# Patient Record
Sex: Female | Born: 1995 | Race: Black or African American | Hispanic: No | Marital: Single | Smoking: Never smoker
Health system: Southern US, Community
[De-identification: ages and names within clinical notes are randomized; demographics above are authoritative.]

## PROBLEM LIST (undated history)

## (undated) HISTORY — PX: DILATION AND CURETTAGE OF UTERUS: SHX78

---

## 2015-04-26 ENCOUNTER — Encounter (HOSPITAL_COMMUNITY): Payer: Self-pay | Admitting: Emergency Medicine

## 2015-04-26 ENCOUNTER — Emergency Department (HOSPITAL_COMMUNITY)
Admission: EM | Admit: 2015-04-26 | Discharge: 2015-04-26 | Disposition: A | Discharge status: Home / Self Care | Payer: Medicaid Other | Attending: Emergency Medicine | Admitting: Emergency Medicine

## 2015-04-26 ENCOUNTER — Emergency Department (HOSPITAL_COMMUNITY): Payer: Medicaid Other

## 2015-04-26 DIAGNOSIS — Y9289 Other specified places as the place of occurrence of the external cause: Secondary | ICD-10-CM | POA: Insufficient documentation

## 2015-04-26 DIAGNOSIS — Y9302 Activity, running: Secondary | ICD-10-CM | POA: Insufficient documentation

## 2015-04-26 DIAGNOSIS — S99921A Unspecified injury of right foot, initial encounter: Secondary | ICD-10-CM | POA: Diagnosis present

## 2015-04-26 DIAGNOSIS — Y998 Other external cause status: Secondary | ICD-10-CM | POA: Diagnosis not present

## 2015-04-26 DIAGNOSIS — X58XXXA Exposure to other specified factors, initial encounter: Secondary | ICD-10-CM | POA: Diagnosis not present

## 2015-04-26 DIAGNOSIS — S93601A Unspecified sprain of right foot, initial encounter: Secondary | ICD-10-CM

## 2015-04-26 MED ORDER — IBUPROFEN 800 MG PO TABS
800.0000 mg | ORAL_TABLET | Freq: Once | ORAL | Status: AC
  Administered 2015-04-26: 800 mg via ORAL
  Filled 2015-04-26: qty 1

## 2015-04-26 MED ORDER — IBUPROFEN 800 MG PO TABS
800.0000 mg | ORAL_TABLET | Freq: Three times a day (TID) | ORAL | Status: DC
Start: 2015-04-26 — End: 2017-01-30

## 2015-04-26 NOTE — Progress Notes (Signed)
pcp isw [facility]: [facility] Address: [address] [place], [place] [id number] Contact: [facility] Telephone: [phone]

## 2015-04-26 NOTE — ED Provider Notes (Signed)
CSN: [id number]     Arrival date & time 04/26/15  1224 History  This chart was scribed for non-physician practitioner Sherlene Shams, PA-C working with Rolland Porter, MD by Murriel Hopper, ED Scribe. This patient was seen in room WTR9/WTR9 and the patient's care was started at 1:28 PM.    Chief Complaint  Patient presents with  . Foot Pain    c/o pain in r/foot x1 day      The history is provided by the patient. No language interpreter was used.     HPI Comments: Jacqueline Carr is a 19 y.o. female who presents to the Emergency Department complaining of constant, worsening right foot pain after pt "rolled" her ankle yesterday with associated swelling. Pt states she was running around with her kids yesterday when the incident happened and states that afterwards it did not hurt, but this morning it hurt when she woke up. Pt states she is able to ambulate with some pain.    History reviewed. No pertinent past medical history. History reviewed. No pertinent past surgical history. History reviewed. No pertinent family history. History  Substance Use Topics  . Smoking status: Never Smoker   . Smokeless tobacco: Not on file  . Alcohol Use: Yes     Comment: occ   OB History    No data available     Review of Systems  Musculoskeletal: Positive for joint swelling, arthralgias and gait problem.      Allergies  Review of patient's allergies indicates no known allergies.  Home Medications   Prior to Admission medications   Not on File   BP 97/69 mmHg  Pulse 59  Temp(Src) 98.1 F (36.7 C) (Oral)  Resp 18  Wt 160 lb (72.576 kg)  SpO2 100%  LMP 04/26/2015 (Exact Date) Physical Exam  Constitutional: She is oriented to person, place, and time. She appears well-developed and well-nourished.  HENT:  Head: Normocephalic and atraumatic.  Cardiovascular: Normal rate.   Pulmonary/Chest: Effort normal.  Abdominal: She exhibits no distension.  Musculoskeletal:  Mild swelling to  the lateral right foot. Tender to palpation over fifth and fourth metatarsals. Pain with range of motion of the fourth and fifth toes at MTP joints. Dorsal pedal pulses are normal. No bruising. No erythema. Refill less than 2 seconds distally. Sensation intact in all toes.  Neurological: She is alert and oriented to person, place, and time.  Skin: Skin is warm and dry.  Psychiatric: She has a normal mood and affect.  Nursing note and vitals reviewed.   ED Course  Procedures (including critical care time)  DIAGNOSTIC STUDIES: Oxygen Saturation is 100% on room air, normal by my interpretation.    COORDINATION OF CARE: 1:32 PM Discussed treatment plan with pt at bedside and pt agreed to plan.   Labs Review Labs Reviewed - No data to display  Imaging Review No results found.   EKG Interpretation None      MDM   Final diagnoses:  Foot sprain, right, initial encounter    Patient emergency department with right foot pain after turning it yesterday. She said she had no pain yesterday, pain worsened today when she woke up. Patient does have mild swelling over fourth and fifth metatarsal with tenderness to palpation. She is having difficulty walking on the foot. X-rays are negative. Patient was provided with an Ace wrap and crutches. Most likely a foot sprain. Heart with ice, elevation, naproxen. Follow up as needed.   Filed Vitals:   04/26/15 1305 04/26/15  1308 04/26/15 1457  BP: 97/69    Pulse: 59  60  Temp: 98.1 F (36.7 C)    TempSrc: Oral    Resp: 18    Weight:  160 lb (72.576 kg)   SpO2: 100%  100%    I personally performed the services described in this documentation, which was scribed in my presence. The recorded information has been reviewed and is accurate.   Jaynie Crumbleatyana Johnross Nabozny, PA-C 04/26/15 2003  Rolland PorterMark James, MD 05/02/15 2047

## 2015-04-26 NOTE — ED Notes (Signed)
Pt stated that she "turned " her r/foot yesterday and is concerned because the pain and swelling have increased today. Slight swelling noted on r/foot. Did not attempt to ice or elevate foot

## 2015-04-26 NOTE — Discharge Instructions (Signed)
Elevate your foot, ice several times a day. ACE wrap for compression. Follow up with your doctor as needed. Tylenol or motrin for pain.  Foot Sprain The muscles and cord like structures which attach muscle to bone (tendons) that surround the feet are made up of units. A foot sprain can occur at the weakest spot in any of these units. This condition is most often caused by injury to or overuse of the foot, as from playing contact sports, or aggravating a previous injury, or from poor conditioning, or obesity. SYMPTOMS  Pain with movement of the foot.  Tenderness and swelling at the injury site.  Loss of strength is present in moderate or severe sprains. THE THREE GRADES OR SEVERITY OF FOOT SPRAIN ARE:  Mild (Grade I): Slightly pulled muscle without tearing of muscle or tendon fibers or loss of strength.  Moderate (Grade II): Tearing of fibers in a muscle, tendon, or at the attachment to bone, with small decrease in strength.  Severe (Grade III): Rupture of the muscle-tendon-bone attachment, with separation of fibers. Severe sprain requires surgical repair. Often repeating (chronic) sprains are caused by overuse. Sudden (acute) sprains are caused by direct injury or over-use. DIAGNOSIS  Diagnosis of this condition is usually by your own observation. If problems continue, a caregiver may be required for further evaluation and treatment. X-rays may be required to make sure there are not breaks in the bones (fractures) present. Continued problems may require physical therapy for treatment. PREVENTION  Use strength and conditioning exercises appropriate for your sport.  Warm up properly prior to working out.  Use athletic shoes that are made for the sport you are participating in.  Allow adequate time for healing. Early return to activities makes repeat injury more likely, and can lead to an unstable arthritic foot that can result in prolonged disability. Mild sprains generally heal in 3 to 10  days, with moderate and severe sprains taking 2 to 10 weeks. Your caregiver can help you determine the proper time required for healing. HOME CARE INSTRUCTIONS   Apply ice to the injury for 15-20 minutes, 03-04 times per day. Put the ice in a plastic bag and place a towel between the bag of ice and your skin.  An elastic wrap (like an Ace bandage) may be used to keep swelling down.  Keep foot above the level of the heart, or at least raised on a footstool, when swelling and pain are present.  Try to avoid use other than gentle range of motion while the foot is painful. Do not resume use until instructed by your caregiver. Then begin use gradually, not increasing use to the point of pain. If pain does develop, decrease use and continue the above measures, gradually increasing activities that do not cause discomfort, until you gradually achieve normal use.  Use crutches if and as instructed, and for the length of time instructed.  Keep injured foot and ankle wrapped between treatments.  Massage foot and ankle for comfort and to keep swelling down. Massage from the toes up towards the knee.  Only take over-the-counter or prescription medicines for pain, discomfort, or fever as directed by your caregiver. SEEK IMMEDIATE MEDICAL CARE IF:   Your pain and swelling increase, or pain is not controlled with medications.  You have loss of feeling in your foot or your foot turns cold or blue.  You develop new, unexplained symptoms, or an increase of the symptoms that brought you to your caregiver. MAKE SURE YOU:  Understand these instructions.  Will watch your condition.  Will get help right away if you are not doing well or get worse. Document Released: 04/06/2002 Document Revised: 01/07/2012 Document Reviewed: 06/03/2008 [facility] Patient Information 2015 [place], [place]. This information is not intended to replace advice given to you by your health care provider. Make sure you discuss any  questions you have with your health care provider.

## 2016-07-11 IMAGING — CR DG FOOT COMPLETE 3+V*R*
1 series · 3 of 3 positions shown · non-contrast
Comparison: None.

CLINICAL DATA: Lateral foot pain and swelling since a fall
yesterday.

EXAM:
RIGHT FOOT COMPLETE - 3+ VIEW

[Series 1: AP · right · 3 of 3 slices shown]
[im 1/3]
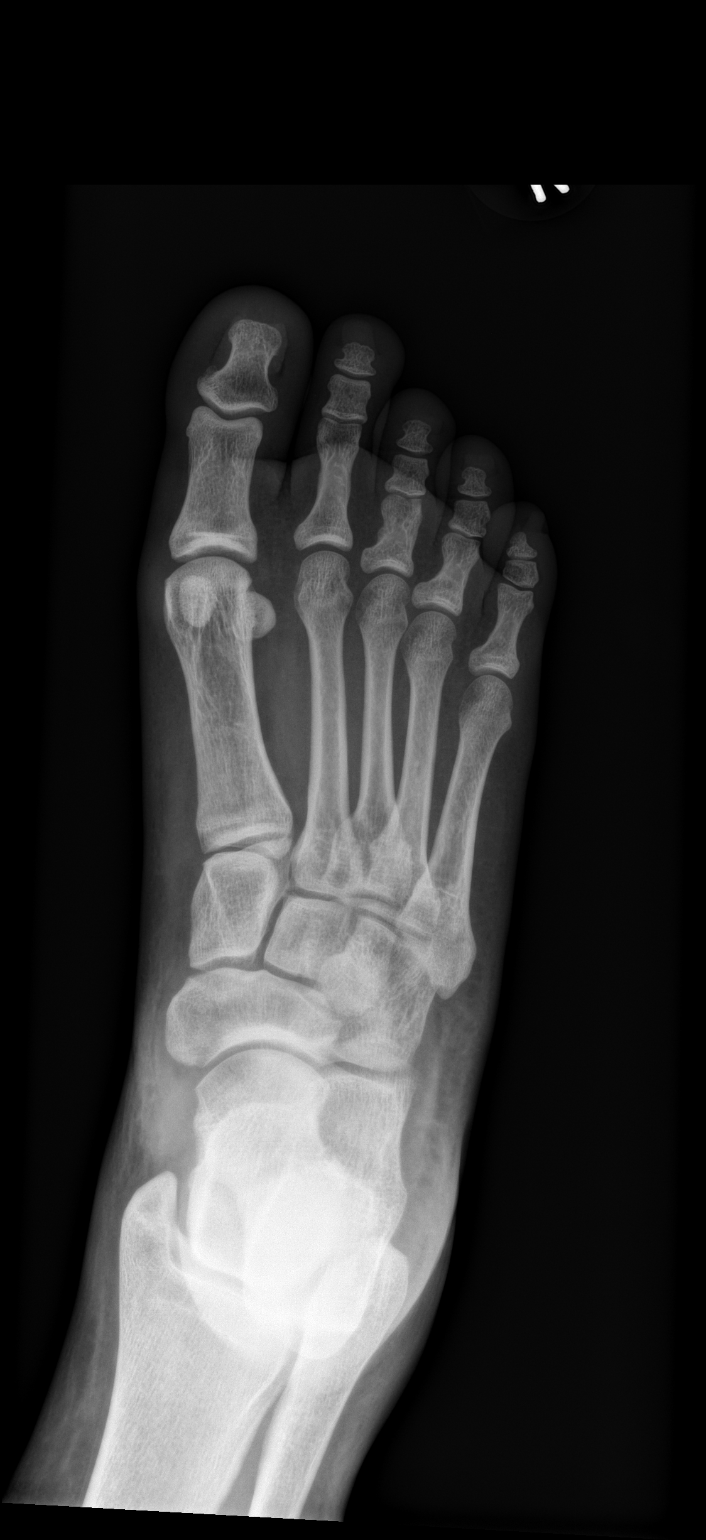
[im 2/3]
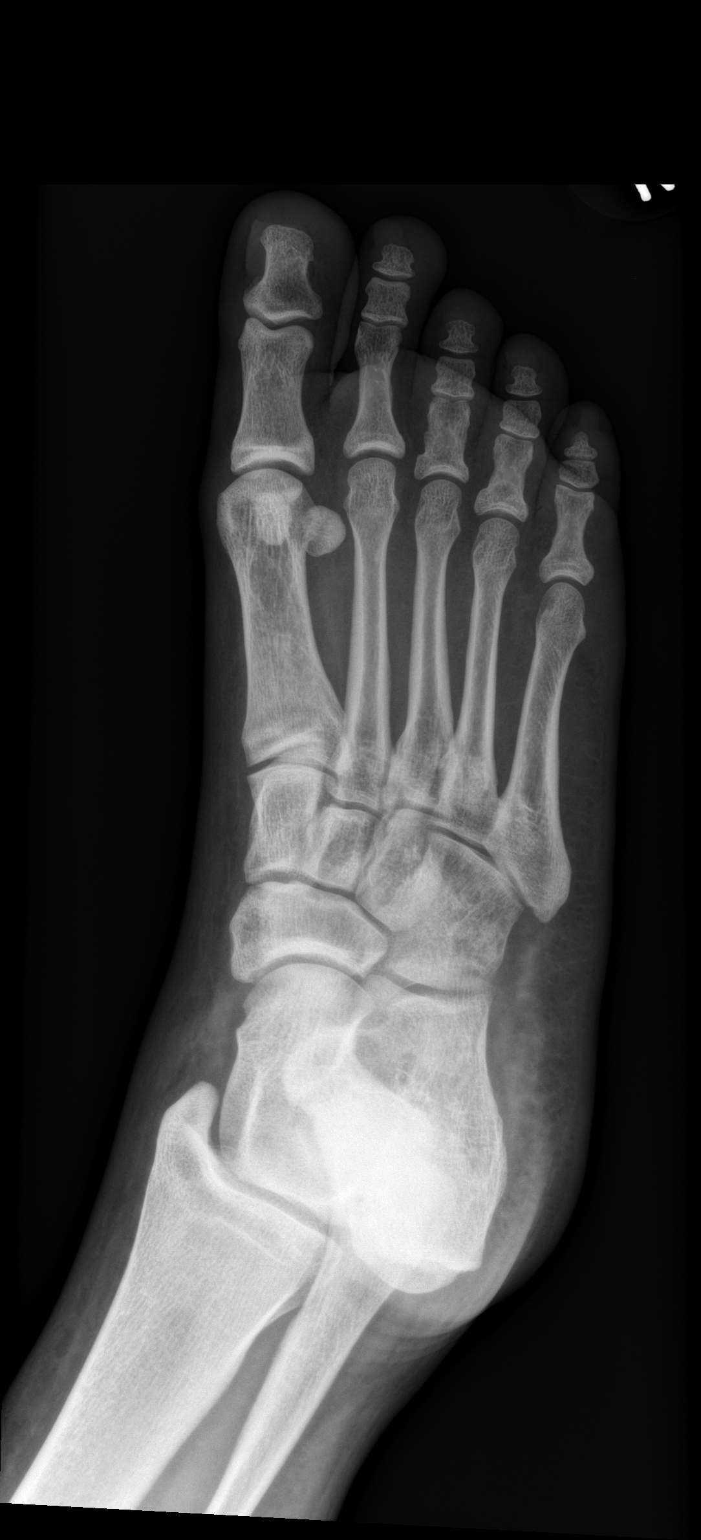
[im 3/3]
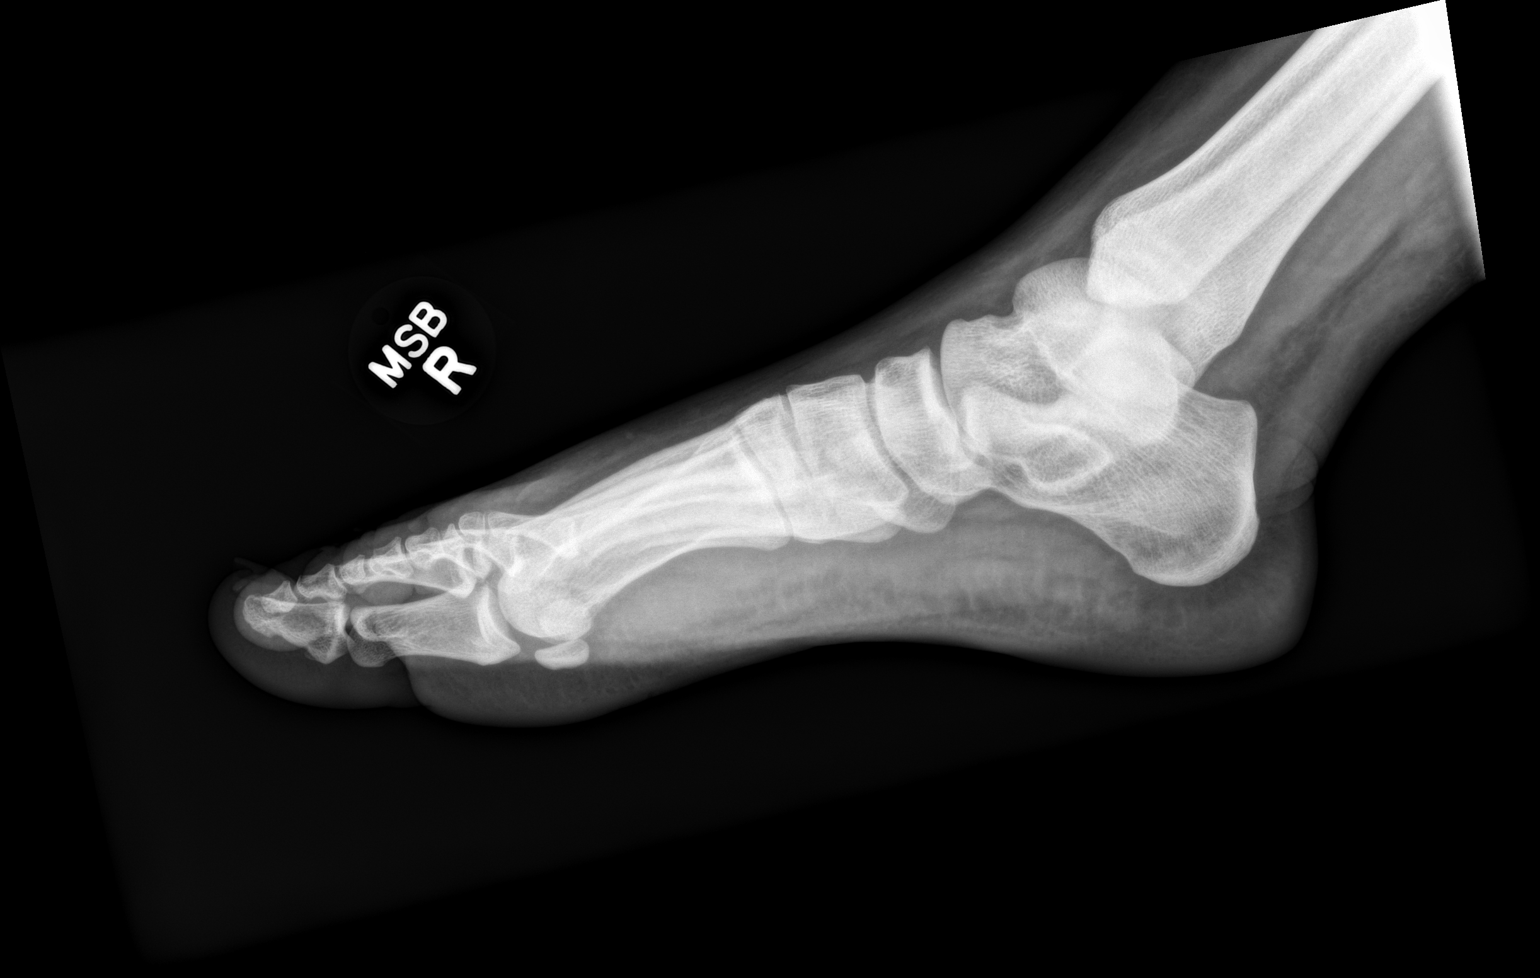

[3 of 3 positions shown; findings below may reference images not displayed]

FINDINGS: There is no evidence of fracture or dislocation. There is no
evidence of arthropathy or other focal bone abnormality. Soft
tissues are unremarkable.
IMPRESSION: Normal exam.

## 2016-08-16 LAB — OB RESULTS CONSOLE GC/CHLAMYDIA
Chlamydia: NEGATIVE
GC PROBE AMP, GENITAL: NEGATIVE

## 2016-08-16 LAB — OB RESULTS CONSOLE ANTIBODY SCREEN: Antibody Screen: NEGATIVE

## 2016-08-16 LAB — OB RESULTS CONSOLE RUBELLA ANTIBODY, IGM: Rubella: IMMUNE

## 2016-08-16 LAB — OB RESULTS CONSOLE HEPATITIS B SURFACE ANTIGEN: Hepatitis B Surface Ag: NEGATIVE

## 2016-08-16 LAB — OB RESULTS CONSOLE HIV ANTIBODY (ROUTINE TESTING): HIV: NONREACTIVE

## 2016-08-16 LAB — OB RESULTS CONSOLE RPR: RPR: NONREACTIVE

## 2016-08-16 LAB — OB RESULTS CONSOLE ABO/RH: RH Type: POSITIVE

## 2016-10-29 NOTE — L&D Delivery Note (Signed)
Patient is 21 y.o. G1P0000 [redacted]w[redacted]d admitted for SROM   Delivery Note At 10:16 PM a viable female was delivered via Vaginal, Spontaneous Delivery  LOP.  APGAR: 6, 9; weight 7 lb 11.5 oz (3500 g).   Complete placenta with 3 vessel cord delivered without complication.   Anesthesia: lidocaine Episiotomy: None Lacerations: left anterior Vaginal;Labial  Suture Repair: 3.0 vicryl Est. Blood Loss (mL): 200  Mom to postpartum.  Baby to Couplet care / Skin to Skin.  Jacqueline Carr 01/30/2017, 78:46 PM  I certify that I was present and gloved for the entirety of the delivery and repair, and I agree with the above documentation and findings.  Luna Kitchens CNM

## 2016-12-31 LAB — OB RESULTS CONSOLE GC/CHLAMYDIA
Chlamydia: NEGATIVE
Gonorrhea: NEGATIVE

## 2016-12-31 LAB — OB RESULTS CONSOLE GBS: GBS: POSITIVE

## 2017-01-28 ENCOUNTER — Encounter (HOSPITAL_COMMUNITY): Payer: Self-pay | Admitting: *Deleted

## 2017-01-28 ENCOUNTER — Telehealth (HOSPITAL_COMMUNITY): Payer: Self-pay | Admitting: *Deleted

## 2017-01-29 NOTE — Telephone Encounter (Signed)
Preadmission screen  

## 2017-01-30 ENCOUNTER — Encounter (HOSPITAL_COMMUNITY): Payer: Self-pay | Admitting: *Deleted

## 2017-01-30 ENCOUNTER — Inpatient Hospital Stay (HOSPITAL_COMMUNITY): Admission: RE | Admit: 2017-01-30 | Payer: Medicaid Other | Source: Ambulatory Visit

## 2017-01-30 ENCOUNTER — Inpatient Hospital Stay (HOSPITAL_COMMUNITY): Payer: Medicaid Other | Admitting: Anesthesiology

## 2017-01-30 ENCOUNTER — Inpatient Hospital Stay (HOSPITAL_COMMUNITY)
Admission: AD | Admit: 2017-01-30 | Discharge: 2017-02-01 | DRG: 775 | Disposition: A | Discharge status: Home / Self Care | Payer: Medicaid Other | Source: Ambulatory Visit | Attending: Obstetrics and Gynecology | Admitting: Obstetrics and Gynecology

## 2017-01-30 DIAGNOSIS — O480 Post-term pregnancy: Secondary | ICD-10-CM | POA: Diagnosis not present

## 2017-01-30 DIAGNOSIS — Z3A41 41 weeks gestation of pregnancy: Secondary | ICD-10-CM

## 2017-01-30 DIAGNOSIS — O4292 Full-term premature rupture of membranes, unspecified as to length of time between rupture and onset of labor: Secondary | ICD-10-CM | POA: Diagnosis present

## 2017-01-30 DIAGNOSIS — O99824 Streptococcus B carrier state complicating childbirth: Secondary | ICD-10-CM | POA: Diagnosis present

## 2017-01-30 LAB — CBC
HEMATOCRIT: 27.2 % — AB (ref 36.0–46.0)
Hemoglobin: 9.2 g/dL — ABNORMAL LOW (ref 12.0–15.0)
MCH: 26.7 pg (ref 26.0–34.0)
MCHC: 33.8 g/dL (ref 30.0–36.0)
MCV: 78.8 fL (ref 78.0–100.0)
Platelets: 241 10*3/uL (ref 150–400)
RBC: 3.45 MIL/uL — ABNORMAL LOW (ref 3.87–5.11)
RDW: 15.4 % (ref 11.5–15.5)
WBC: 9.6 10*3/uL (ref 4.0–10.5)

## 2017-01-30 LAB — TYPE AND SCREEN
ABO/RH(D): B POS
Antibody Screen: NEGATIVE

## 2017-01-30 MED ORDER — ACETAMINOPHEN 325 MG PO TABS: 650.0000 mg | ORAL_TABLET | ORAL | Status: DC | PRN

## 2017-01-30 MED ORDER — FENTANYL 2.5 MCG/ML BUPIVACAINE 1/10 % EPIDURAL INFUSION (WH - ANES)
14.0000 mL/h | INTRAMUSCULAR | Status: DC | PRN
  Administered 2017-01-30 (×2): 14 mL/h via EPIDURAL
  Filled 2017-01-30 (×2): qty 100

## 2017-01-30 MED ORDER — ZOLPIDEM TARTRATE 5 MG PO TABS: 5.0000 mg | ORAL_TABLET | Freq: Every evening | ORAL | Status: DC | PRN

## 2017-01-30 MED ORDER — PHENYLEPHRINE 40 MCG/ML (10ML) SYRINGE FOR IV PUSH (FOR BLOOD PRESSURE SUPPORT)
80.0000 ug | PREFILLED_SYRINGE | INTRAVENOUS | Status: DC | PRN
  Filled 2017-01-30: qty 10
  Filled 2017-01-30: qty 5

## 2017-01-30 MED ORDER — LIDOCAINE HCL (PF) 1 % IJ SOLN
30.0000 mL | INTRAMUSCULAR | Status: DC | PRN
  Administered 2017-01-30: 30 mL via SUBCUTANEOUS
  Filled 2017-01-30: qty 30

## 2017-01-30 MED ORDER — ONDANSETRON HCL 4 MG/2ML IJ SOLN
4.0000 mg | Freq: Four times a day (QID) | INTRAMUSCULAR | Status: DC | PRN
  Administered 2017-01-30: 4 mg via INTRAVENOUS
  Filled 2017-01-30: qty 2

## 2017-01-30 MED ORDER — OXYTOCIN BOLUS FROM INFUSION
500.0000 mL | Freq: Once | INTRAVENOUS | Status: AC
  Administered 2017-01-30: 500 mL via INTRAVENOUS

## 2017-01-30 MED ORDER — PENICILLIN G POTASSIUM 5000000 UNITS IJ SOLR
5.0000 10*6.[IU] | Freq: Once | INTRAVENOUS | Status: AC
  Administered 2017-01-30: 5 10*6.[IU] via INTRAVENOUS
  Filled 2017-01-30: qty 5

## 2017-01-30 MED ORDER — PRENATAL MULTIVITAMIN CH
1.0000 | ORAL_TABLET | Freq: Every day | ORAL | Status: DC
  Filled 2017-01-30: qty 1

## 2017-01-30 MED ORDER — LACTATED RINGERS IV SOLN
500.0000 mL | Freq: Once | INTRAVENOUS | Status: AC
  Administered 2017-01-30: 500 mL via INTRAVENOUS

## 2017-01-30 MED ORDER — TERBUTALINE SULFATE 1 MG/ML IJ SOLN
0.2500 mg | Freq: Once | INTRAMUSCULAR | Status: DC | PRN
  Filled 2017-01-30: qty 1

## 2017-01-30 MED ORDER — OXYCODONE-ACETAMINOPHEN 5-325 MG PO TABS: 2.0000 | ORAL_TABLET | ORAL | Status: DC | PRN

## 2017-01-30 MED ORDER — FLEET ENEMA 7-19 GM/118ML RE ENEM: 1.0000 | ENEMA | Freq: Every day | RECTAL | Status: DC | PRN

## 2017-01-30 MED ORDER — PENICILLIN G POT IN DEXTROSE 60000 UNIT/ML IV SOLN
3.0000 10*6.[IU] | INTRAVENOUS | Status: DC
  Administered 2017-01-30 (×2): 3 10*6.[IU] via INTRAVENOUS
  Filled 2017-01-30 (×7): qty 50

## 2017-01-30 MED ORDER — LACTATED RINGERS IV SOLN
INTRAVENOUS | Status: DC
  Administered 2017-01-30: 11:00:00 via INTRAVENOUS

## 2017-01-30 MED ORDER — EPHEDRINE 5 MG/ML INJ
10.0000 mg | INTRAVENOUS | Status: DC | PRN
  Filled 2017-01-30: qty 2

## 2017-01-30 MED ORDER — OXYCODONE-ACETAMINOPHEN 5-325 MG PO TABS: 1.0000 | ORAL_TABLET | ORAL | Status: DC | PRN

## 2017-01-30 MED ORDER — CALCIUM CARBONATE ANTACID 500 MG PO CHEW: 2.0000 | CHEWABLE_TABLET | ORAL | Status: DC | PRN

## 2017-01-30 MED ORDER — DIPHENHYDRAMINE HCL 50 MG/ML IJ SOLN: 12.5000 mg | INTRAMUSCULAR | Status: DC | PRN

## 2017-01-30 MED ORDER — DOCUSATE SODIUM 100 MG PO CAPS
100.0000 mg | ORAL_CAPSULE | Freq: Every day | ORAL | Status: DC
  Filled 2017-01-30: qty 1

## 2017-01-30 MED ORDER — FENTANYL CITRATE (PF) 100 MCG/2ML IJ SOLN
100.0000 ug | INTRAMUSCULAR | Status: DC | PRN
  Administered 2017-01-30 (×2): 100 ug via INTRAVENOUS
  Filled 2017-01-30 (×2): qty 2

## 2017-01-30 MED ORDER — OXYTOCIN 40 UNITS IN LACTATED RINGERS INFUSION - SIMPLE MED
2.5000 [IU]/h | INTRAVENOUS | Status: DC
  Filled 2017-01-30 (×2): qty 1000

## 2017-01-30 MED ORDER — LACTATED RINGERS IV SOLN
500.0000 mL | INTRAVENOUS | Status: DC | PRN
  Administered 2017-01-30: 500 mL via INTRAVENOUS

## 2017-01-30 MED ORDER — LIDOCAINE HCL (PF) 1 % IJ SOLN
INTRAMUSCULAR | Status: DC | PRN
  Administered 2017-01-30 (×2): 6 mL via EPIDURAL

## 2017-01-30 MED ORDER — SOD CITRATE-CITRIC ACID 500-334 MG/5ML PO SOLN: 30.0000 mL | ORAL | Status: DC | PRN

## 2017-01-30 MED ORDER — OXYTOCIN 40 UNITS IN LACTATED RINGERS INFUSION - SIMPLE MED
1.0000 m[IU]/min | INTRAVENOUS | Status: DC
  Administered 2017-01-30: 2 m[IU]/min via INTRAVENOUS

## 2017-01-30 NOTE — Progress Notes (Signed)
LABOR PROGRESS NOTE  Abcde Oneil is a 21 y.o. G1P0000 at [redacted]w[redacted]d  admitted for SOL.  Subjective: Patient is comfortable at the moment, Pitocin was started earlier this afternoon and patient seem to be tolerating it. Pain is well controlled.  Objective: BP 131/86   Pulse 61   Temp 98.8 F (37.1 C) (Oral)   Resp 18   Ht  (1.6 m)   Wt 184 lb (83.5 kg)   SpO2 100%   BMI 32.59 kg/m  or  Vitals:   01/30/17 1401 01/30/17 1431 01/30/17 1501 01/30/17 1531  BP: 107/61 124/86 129/81 131/86  Pulse: 65 74 69 61  Resp: Temp:   98.8 F (37.1 C)   TempSrc:   Oral   SpO2:      Weight:      Height:         Dilation: 6 Effacement (%): 90 Station: -1 Presentation: Vertex Exam by:: stone rnc  Labs: Lab Results  Component Value Date   WBC 9.6 01/30/2017   HGB 9.2 (L) 01/30/2017   HCT 27.2 (L) 01/30/2017   MCV 78.8 01/30/2017   PLT 241 01/30/2017    Patient Active Problem List   Diagnosis Date Noted  . Normal labor and delivery 01/30/2017    Assessment / Plan: 21 y.o. G1P0000 at [redacted]w[redacted]d here for SOL currently undergoing augmentation with pitocin.   Labor: Continue augmentation of labor  Fetal Wellbeing: Category 1 Pain Control:  Epidural Anticipated MOD:  Vaginal  Lovena Neighbours, MD 01/30/2017, 3:38 PM

## 2017-01-30 NOTE — Anesthesia Procedure Notes (Signed)
Epidural Patient location during procedure: OB Start time: 01/30/2017 10:25 AM End time: 01/30/2017 10:28 AM  Staffing Anesthesiologist: Leilani Able Performed: anesthesiologist   Preanesthetic Checklist Completed: patient identified, surgical consent, pre-op evaluation, timeout performed, IV checked, risks and benefits discussed and monitors and equipment checked  Epidural Patient position: sitting Prep: site prepped and draped and DuraPrep Patient monitoring: continuous pulse ox and blood pressure Approach: midline Injection technique: LOR air  Needle:  Needle type: Tuohy  Needle gauge: 17 G Needle length: 9 cm and 9 Needle insertion depth: 5 cm cm Catheter type: closed end flexible Catheter size: 19 Gauge Catheter at skin depth: 10 cm Test dose: negative and Other  Assessment Sensory level: T10 Events: blood not aspirated, injection not painful, no injection resistance, negative IV test and no paresthesia

## 2017-01-30 NOTE — Progress Notes (Signed)
   Jacqueline Carr is a 21 y.o. G1P0000 at [redacted]w[redacted]d  admitted for SROM.   Subjective: Patient feeling the urge to push; nurse at the bedside providing support and direction.   Objective: Vitals:   01/30/17 1801 01/30/17 1829 01/30/17 1833 01/30/17 1901  BP: 121/74  (!) 151/93 132/63  Pulse: 62  61 (!) 56  Resp: 18   18  Temp:  99.9 F (37.7 C)    TempSrc:  Oral    SpO2:      Weight:      Height:       No intake/output data recorded.  FHT:  FHR: 145 bpm, variability: moderate,  accelerations:  Present,  decelerations:  Absent UC:   irregular, every 1-3 minutes SVE:   Dilation: 10 Effacement (%): 100 Station: +2 Exam by:: Melburn Popper, RN Pitocin @  12 mu/min FHR is difficult to continously trace due to maternal pushing efforts.   Labs: Lab Results  Component Value Date   WBC 9.6 01/30/2017   HGB 9.2 (L) 01/30/2017   HCT 27.2 (L) 01/30/2017   MCV 78.8 01/30/2017   PLT 241 01/30/2017    Assessment / Plan: Spontaneous labor, progressing normally  Labor: Progressing normally Fetal Wellbeing:  Category I Pain Control:  Epidural Anticipated MOD:  NSVD  Charlesetta Garibaldi Damira Kem CNM 01/30/2017, 9:08 PM

## 2017-01-30 NOTE — Anesthesia Pain Management Evaluation Note (Signed)
  CRNA Pain Management Visit Note  Patient: Jacqueline Carr, 21 y.o., female  "Hello I am a member of the anesthesia team at [facility]. We have an anesthesia team available at all times to provide care throughout the hospital, including epidural management and anesthesia for C-section. I don't know your plan for the delivery whether it a natural birth, water birth, IV sedation, nitrous supplementation, doula or epidural, but we want to meet your pain goals."   1.Was your pain managed to your expectations on prior hospitalizations?   No prior hospitalizations  2.What is your expectation for pain management during this hospitalization?     Epidural  3.How can we help you reach that goal? unsure  Record the patient's initial score and the patient's pain goal.   Pain: 0  Pain Goal: 6 [facility] wants you to be able to say your pain was always managed very well.  Cephus Shelling 01/30/2017

## 2017-01-30 NOTE — Anesthesia Preprocedure Evaluation (Signed)
Anesthesia Evaluation  Patient identified by MRN, date of birth, ID band Patient awake    Reviewed: Allergy & Precautions, H&P , NPO status , Patient's Chart, lab work & pertinent test results  Airway Mallampati: I  TM Distance: >3 FB Neck ROM: full    Dental no notable dental hx.    Pulmonary neg pulmonary ROS,    Pulmonary exam normal        Cardiovascular negative cardio ROS Normal cardiovascular exam     Neuro/Psych negative neurological ROS  negative psych ROS   GI/Hepatic negative GI ROS, Neg liver ROS,   Endo/Other  negative endocrine ROS  Renal/GU negative Renal ROS  negative genitourinary   Musculoskeletal negative musculoskeletal ROS (+)   Abdominal Normal abdominal exam  (+)   Peds  Hematology negative hematology ROS (+)   Anesthesia Other Findings   Reproductive/Obstetrics (+) Pregnancy                             Anesthesia Physical Anesthesia Plan  ASA: II  Anesthesia Plan: Epidural   Post-op Pain Management:    Induction:   Airway Management Planned:   Additional Equipment:   Intra-op Plan:   Post-operative Plan:   Informed Consent: I have reviewed the patients History and Physical, chart, labs and discussed the procedure including the risks, benefits and alternatives for the proposed anesthesia with the patient or authorized representative who has indicated his/her understanding and acceptance.     Plan Discussed with:   Anesthesia Plan Comments:         Anesthesia Quick Evaluation  

## 2017-01-30 NOTE — Progress Notes (Signed)
Vonna Drafts asked MAU RN to hold pt for a few minutes; K.Lanis Storlie,RNC called back at [phone] and said that pt can come to 169; the pt's IV had not been started yet and they said they will bring pt after starting iv.Marland KitchenMarland KitchenMarland Kitchen

## 2017-01-30 NOTE — H&P (Signed)
LABOR ADMISSION HISTORY AND PHYSICAL  Jacqueline Carr is a 21 y.o. female G1P0 with IUP at [redacted]w[redacted]d by 19w [place] presenting for SROM. She reports contraction every 6-10 minutes that is getting stronger and more frequent for two days . Fetal movement present and at baseline. Denies fluid leak or gush, vaginal bleeding,  headaches, blurry vision, RUQ pain or peripheral edema. She plans on breast feeding. She is undecided for birth control.  Plans on bringing child to undecided for pediatric care after discharge.   Dating: By [redacted]w[redacted]d --->  Estimated Date of Delivery: 01/23/17  Sono:    , CWD, normal anatomy, cephalic  presentation, longitudinal lie, 2816g, 81% EFW   Prenatal History/Complications: GBS positivity  Past Medical History: History reviewed. No pertinent past medical history.  Past Surgical History: History reviewed. No pertinent surgical history.  Obstetrical History: OB History    Gravida Para Term Preterm AB Living   1 0 0 0 0 0   SAB TAB Ectopic Multiple Live Births   0 0 0 0 0      Social History: Social History   Social History  . Marital status: Single    Spouse name: N/A  . Number of children: N/A  . Years of education: N/A   Social History Main Topics  . Smoking status: Never Smoker  . Smokeless tobacco: Never Used  . Alcohol use Yes     Comment: occ  . Drug use: No  . Sexual activity: Yes    Birth control/ protection: None   Other Topics Concern  . None   Social History Narrative  . None    Family History: History reviewed. No pertinent family history.  Allergies: No Known Allergies  Prescriptions Prior to Admission  Medication Sig Dispense Refill Last Dose  . ibuprofen (ADVIL,MOTRIN) 800 MG tablet Take 1 tablet (800 mg total) by mouth 3 (three) times daily. 21 tablet 0      Review of Systems  Blood pressure 137/87, pulse (!) 51, height  (1.6 m), weight 184 lb (83.5 kg), SpO2 100 %. GEN: appearance: alert, cooperative and  appears stated age RESP: clear to auscultation bilaterally, no increased WOB CVS:: regular rate and rhythm, no murmurs, no sign of DVT, +2 DP GI: soft, non-tender; bowel sounds normal MSK: WWP, Homans sign is negative,  NEURO: grossly intact PSYCH: appropriate mood and affect  Pelvic Exam: Cervical exam: Dilation: 3.5 Effacement (%): 80 Station: -2 Exam by:: Dr.Gonfa Presentation: cephalic Uterine activityDate/time of onset: 01/27/2017, Frequency: Every 3-5 minutes, Duration: 30-40 seconds and Intensity: strong  Fetal monitoringBaseline: 140 bpm, Variability: Good {> 6 bpm), Accelerations: Reactive and Decelerations: Absent  Prenatal labs: ABO, Rh: B/Positive/-- (10/19 0000) Antibody: Negative (10/19 0000) Rubella: !Error! nonimmune RPR: Nonreactive (10/19 0000)  HBsAg: Negative (10/19 0000)  HIV: Non-reactive (10/19 0000)  GBS: Positive (03/05 0000)  1 hr Glucola negative Genetic screening normal Anatomy US normal  Prenatal Transfer Tool  Maternal Diabetes: No Genetic Screening: Normal Maternal Ultrasounds/Referrals: Normal Fetal Ultrasounds or other Referrals:  None Maternal Substance Abuse:  Yes:  Type: Marijuana. Subsequent UDS was negative Significant Maternal Medications:  None Significant Maternal Lab Results: Lab values include: Group B Strep positive  No results found for this or any previous visit (from the past 24 hour(s)).  Patient Active Problem List   Diagnosis Date Noted  . Normal labor and delivery 01/30/2017    Assessment: Jacqueline Carr is a 21 y.o. G1P0 at [redacted]w[redacted]d here for SROM with contraction.  #Labor: expectant.  She is contracting every 3-5 minutes #Pain: Epidural up on request #FWB: CAT-1 #ID: [id number].  #MOF: breast #MOC: undecided  Almon Hercules 01/30/2017, 7:35 AM  OB FELLOW HISTORY AND PHYSICAL ATTESTATION  I have seen and examined this patient; I agree with above documentation in the resident's note.    Jen Mow, DO OB Fellow 01/30/2017, 9:34 AM

## 2017-01-30 NOTE — MAU Note (Signed)
L/D CHARGE SAYS   CAN'T  TAKE  PT  AT THIS  TIME

## 2017-01-30 NOTE — MAU Note (Signed)
PT  SAYS SROM  AT 0540  - CLEAR  FLUID.   PNC  WITH  HD  - VE  LAST WEEK - 1   CM  .   DENIES HSV AND  MRSA.  GBS- POSITIVE.

## 2017-01-31 ENCOUNTER — Encounter (HOSPITAL_COMMUNITY): Payer: Self-pay

## 2017-01-31 ENCOUNTER — Inpatient Hospital Stay (HOSPITAL_COMMUNITY): Admission: RE | Admit: 2017-01-31 | Payer: Medicaid Other | Source: Ambulatory Visit

## 2017-01-31 LAB — CBC
HCT: 23.9 % — ABNORMAL LOW (ref 36.0–46.0)
Hemoglobin: 8.2 g/dL — ABNORMAL LOW (ref 12.0–15.0)
MCH: 27 pg (ref 26.0–34.0)
MCHC: 34.3 g/dL (ref 30.0–36.0)
MCV: 78.6 fL (ref 78.0–100.0)
PLATELETS: 211 10*3/uL (ref 150–400)
RBC: 3.04 MIL/uL — AB (ref 3.87–5.11)
RDW: 15.7 % — ABNORMAL HIGH (ref 11.5–15.5)
WBC: 16.3 10*3/uL — ABNORMAL HIGH (ref 4.0–10.5)

## 2017-01-31 LAB — ABO/RH: ABO/RH(D): B POS

## 2017-01-31 LAB — RPR: RPR: NONREACTIVE

## 2017-01-31 MED ORDER — SIMETHICONE 80 MG PO CHEW: 80.0000 mg | CHEWABLE_TABLET | ORAL | Status: DC | PRN

## 2017-01-31 MED ORDER — ONDANSETRON HCL 4 MG PO TABS: 4.0000 mg | ORAL_TABLET | ORAL | Status: DC | PRN

## 2017-01-31 MED ORDER — ONDANSETRON HCL 4 MG/2ML IJ SOLN: 4.0000 mg | INTRAMUSCULAR | Status: DC | PRN

## 2017-01-31 MED ORDER — PRENATAL MULTIVITAMIN CH
1.0000 | ORAL_TABLET | Freq: Every day | ORAL | Status: DC
  Filled 2017-01-31 (×2): qty 1

## 2017-01-31 MED ORDER — DIBUCAINE 1 % RE OINT
1.0000 | TOPICAL_OINTMENT | RECTAL | Status: DC | PRN
Start: 2017-01-31 — End: 2017-02-01

## 2017-01-31 MED ORDER — DIPHENHYDRAMINE HCL 25 MG PO CAPS: 25.0000 mg | ORAL_CAPSULE | Freq: Four times a day (QID) | ORAL | Status: DC | PRN

## 2017-01-31 MED ORDER — TETANUS-DIPHTH-ACELL PERTUSSIS 5-2.5-18.5 LF-MCG/0.5 IM SUSP: 0.5000 mL | Freq: Once | INTRAMUSCULAR | Status: DC

## 2017-01-31 MED ORDER — ACETAMINOPHEN 325 MG PO TABS: 650.0000 mg | ORAL_TABLET | ORAL | Status: DC | PRN

## 2017-01-31 MED ORDER — IBUPROFEN 600 MG PO TABS
600.0000 mg | ORAL_TABLET | Freq: Four times a day (QID) | ORAL | Status: DC
  Administered 2017-01-31 – 2017-02-01 (×6): 600 mg via ORAL
  Filled 2017-01-31 (×7): qty 1

## 2017-01-31 MED ORDER — WITCH HAZEL-GLYCERIN EX PADS
1.0000 | MEDICATED_PAD | CUTANEOUS | Status: DC | PRN
Start: 2017-01-31 — End: 2017-02-01

## 2017-01-31 MED ORDER — COCONUT OIL OIL: 1.0000 "application " | TOPICAL_OIL | Status: DC | PRN

## 2017-01-31 MED ORDER — SENNOSIDES-DOCUSATE SODIUM 8.6-50 MG PO TABS
2.0000 | ORAL_TABLET | ORAL | Status: DC
  Administered 2017-01-31 (×2): 2 via ORAL
  Filled 2017-01-31 (×2): qty 2

## 2017-01-31 MED ORDER — BENZOCAINE-MENTHOL 20-0.5 % EX AERO
1.0000 "application " | INHALATION_SPRAY | CUTANEOUS | Status: DC | PRN
  Administered 2017-01-31: 1 via TOPICAL
  Filled 2017-01-31: qty 56

## 2017-01-31 MED ORDER — ZOLPIDEM TARTRATE 5 MG PO TABS: 5.0000 mg | ORAL_TABLET | Freq: Every evening | ORAL | Status: DC | PRN

## 2017-01-31 NOTE — Progress Notes (Signed)
Post Partum Day #1 Subjective: no complaints, up ad lib, voiding, tolerating PO and reports normal lochia  Objective: Blood pressure 123/69, pulse (!) 59, temperature 98 F (36.7 C), temperature source Oral, resp. rate 18, height  (1.6 m), weight 83.5 kg (184 lb), SpO2 100 %, unknown if currently breastfeeding.  Physical Exam:  General: alert Lochia: appropriate Uterine Fundus: firm and NT at U DVT Evaluation: No evidence of DVT seen on physical exam.   Recent Labs  01/30/17 0826 01/31/17 0538  HGB 9.2* 8.2*  HCT 27.2* 23.9*    Assessment/Plan: Plan for discharge tomorrow  Undecided about contraception   LOS: 1 day   Allie Bossier 01/31/2017, 6:45 AM

## 2017-01-31 NOTE — Lactation Note (Signed)
This note was copied from a baby's chart. Lactation Consultation Note  Patient Name: Jacqueline Carr [id number] Date: 01/31/2017 Reason for consult: Follow-up assessment;Other (Comment) (per mom baby last fed at 1145 for 8 mins, enc  to page latch)  Per mom noted swallows when the baby fed.  Baby presently sleeping in crib at bedside.  LC reviewed doc flow sheets with mom and updated, also asked mom to call with feeding cues.    Maternal Data    Feeding Feeding Type: Breast Fed Length of feed: 8 min (per mom )  LATCH Score/Interventions                Intervention(s): Breastfeeding basics reviewed     Lactation Tools Discussed/Used     Consult Status Consult Status: Follow-up Date: 01/31/17 Follow-up type: In-patient    Matilde Sprang Kimoni Pagliarulo 01/31/2017, 2:41 PM

## 2017-01-31 NOTE — Anesthesia Postprocedure Evaluation (Signed)
Anesthesia Post Note  Patient: Jacqueline Carr  Procedure(s) Performed: * No procedures listed *  Patient location during evaluation: Mother Baby Anesthesia Type: Epidural Level of consciousness: awake and alert Pain management: satisfactory to patient Vital Signs Assessment: post-procedure vital signs reviewed and stable Respiratory status: respiratory function stable Cardiovascular status: stable Postop Assessment: no headache, no backache, epidural receding, patient able to bend at knees, no signs of nausea or vomiting and adequate PO intake Anesthetic complications: no        Last Vitals:  Vitals:   01/31/17 0105 01/31/17 0545  BP: 116/76 123/69  Pulse: 73 (!) 59  Resp: 17 18  Temp: 37.1 C 36.7 C    Last Pain:  Vitals:   01/31/17 0545  TempSrc: Oral  PainSc: 0-No pain   Pain Goal:                 [name]
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
## 2017-01-31 NOTE — Lactation Note (Addendum)
This note was copied from a baby's chart. Lactation Consultation Note New mom w/lots of questions. Plans on BF w/pumping occasionally so family members can assist in feeding baby. Discussed importance of BF exclusively for 2 weeks before alternating feedings.  Educated newborn behavior, feeding habits, STS, I&O, cluster feeding, supply and demand.  Mom has very large pendulum breast w/everted nipples. Latched baby well in football position w/assist. Baby popped off breast, mom demonstrated latching per self. Denies painful latch. Discussed obtaining deep latch and preventing nipple soreness. Mom encouraged to feed baby 8-12 times/24 hours and with feeding cues. encouraged to wake baby for feedings if hasn't cued in 3-hours. WH/LC brochure given w/resources, support groups and LC services. Encouraged to call for questions or concerns. Patient Name: Jacqueline Carr [id number] Date: 01/31/2017 Reason for consult: Initial assessment   Maternal Data Has patient been taught Hand Expression?: Yes Does the patient have breastfeeding experience prior to this delivery?: No  Feeding Feeding Type: Breast Fed Length of feed: 10 min (still BF)  LATCH Score/Interventions Latch: Grasps breast easily, tongue down, lips flanged, rhythmical sucking. Intervention(s): Adjust position;Assist with latch;Breast massage;Breast compression  Audible Swallowing: A few with stimulation Intervention(s): Skin to skin;Hand expression;Alternate breast massage  Type of Nipple: Everted at rest and after stimulation  Comfort (Breast/Nipple): Soft / non-tender     Hold (Positioning): Assistance needed to correctly position infant at breast and maintain latch. Intervention(s): Breastfeeding basics reviewed;Support Pillows;Position options;Skin to skin  LATCH Score: 8  Lactation Tools Discussed/Used WIC Program: No   Consult Status Consult Status: Follow-up Date: 02/01/17 Follow-up type:  In-patient    Amery Vandenbos, Diamond Nickel 01/31/2017, 6:00 AM

## 2017-02-01 MED ORDER — IBUPROFEN 600 MG PO TABS
600.0000 mg | ORAL_TABLET | Freq: Four times a day (QID) | ORAL | 0 refills | Status: DC
Start: 2017-02-01 — End: 2024-03-17

## 2017-02-01 NOTE — Lactation Note (Signed)
This note was copied from a baby's chart. Lactation Consultation Note  Patient Name: Jacqueline Carr Date: 02/01/2017 Reason for consult: Follow-up assessment    With this first time mom and term baby, now 52 hours old. The baby is now cluster feeding, which I reviewed with the parents. Mom has an abundant supply of colostrum, easily forming a stream with hand expression. Breast care/engorgement  reviewed. Mom reports using football hold, and baby latching and feeding well. Mom knows to call for questions/concerns.   Maternal Data    Feeding Feeding Type: Breast Fed Length of feed: 52 min  LATCH Score/Interventions Latch: Grasps breast easily, tongue down, lips flanged, rhythmical sucking.  Audible Swallowing:  (milk squirting  far with hand expression)  Type of Nipple: Everted at rest and after stimulation  Comfort (Breast/Nipple): Soft / non-tender     Hold (Positioning): No assistance needed to correctly position infant at breast.  LATCH Score: 9  Lactation Tools Discussed/Used     Consult Status Consult Status: Complete Follow-up type: Call as needed    Alfred Levins 02/01/2017, 11:04 AM

## 2017-02-01 NOTE — Discharge Summary (Signed)
       OB Discharge Summary  Patient Name: Jacqueline Carr DOB: 09/15/1996 MRN: [id number]  Date of admission: 01/30/2017 Delivering MD: Candelaria Stagers   Date of discharge: 02/01/2017  Admitting diagnosis: 41 WEEKS CTX ROM Intrauterine pregnancy: [redacted]w[redacted]d     Secondary diagnosis:Active Problems:   Normal labor and delivery     Discharge diagnosis: Term Pregnancy Delivered                                                                     Complications: None  Hospital course:  Onset of Labor With Vaginal Delivery     21 y.o. yo G1P1001 at [redacted]w[redacted]d was admitted in Active Labor on 01/30/2017. Patient had an uncomplicated labor course as follows:  Membrane Rupture Time/Date: 5:00 AM ,01/30/2017   Intrapartum Procedures: Episiotomy: None [1]                                         Lacerations:  Vaginal [6];Labial [10]  Patient had a delivery of a Viable infant. 01/30/2017  Information for the patient's newborn:  Keshawna, Dix [[id number]]  Delivery Method: Vag-Spont    Pateint had an uncomplicated postpartum course.  She is ambulating, tolerating a regular diet, passing flatus, and urinating well. Patient is discharged home in stable condition on 02/01/17.   Physical exam  Vitals:   01/31/17 0545 01/31/17 1134 01/31/17 1731 02/01/17 0632  BP: 123/69 123/78 (!) 147/93 130/67  Pulse: (!) 59 79 90 78  Resp: Temp: 98 F (36.7 C) 98 F (36.7 C) 98.8 F (37.1 C) 98.2 F (36.8 C)  TempSrc: Oral Oral Oral Oral  SpO2:  98%    Weight:      Height:       General: alert Lochia: appropriate Uterine Fundus: firm and NT at U-1 Incision: N/A DVT Evaluation: No evidence of DVT seen on physical exam. Labs: Lab Results  Component Value Date   WBC 16.3 (H) 01/31/2017   HGB 8.2 (L) 01/31/2017   HCT 23.9 (L) 01/31/2017   MCV 78.6 01/31/2017   PLT 211 01/31/2017   No flowsheet data found.  Discharge instruction: per After Visit Summary and "Baby and Me Booklet".  After  Visit Meds:  Allergies as of 02/01/2017   No Known Allergies     Medication List    TAKE these medications   ibuprofen 600 MG tablet Commonly known as:  ADVIL,MOTRIN Take 1 tablet (600 mg total) by mouth every 6 (six) hours.   prenatal multivitamin Tabs tablet Take 1 tablet by mouth daily at 12 noon.       Diet: routine diet  Activity: Advance as tolerated. Pelvic rest for 6 weeks.   Outpatient follow up:6 weeks Follow up Appt:No future appointments. Follow up visit: No Follow-up on file.  Postpartum contraception: Abstinence  Newborn Data: Live born female  Birth Weight: 7 lb 11.5 oz (3500 g) APGAR: 6, 9  Baby Feeding: Breast Disposition:home with mother   02/01/2017 Allie Bossier, MD

## 2017-02-01 NOTE — Discharge Instructions (Signed)

## 2023-03-22 ENCOUNTER — Encounter: Payer: Self-pay | Admitting: Podiatry

## 2023-03-22 ENCOUNTER — Ambulatory Visit: Payer: Medicaid Other | Admitting: Podiatry

## 2023-03-22 DIAGNOSIS — R252 Cramp and spasm: Secondary | ICD-10-CM

## 2023-03-22 DIAGNOSIS — B351 Tinea unguium: Secondary | ICD-10-CM

## 2023-03-22 NOTE — Addendum Note (Signed)
Addended by: Myriam Jacobson on: 03/22/2023 01:30 PM   Modules accepted: Orders

## 2023-03-22 NOTE — Progress Notes (Signed)
  Subjective:  Patient ID: Jacqueline Carr, female    DOB: 11/12/1995,   MRN: [id number]  Chief Complaint  Patient presents with   Nail Problem    Nail fungus    27 y.o. female presents for concern of bilateral toenail fungus that has been going on for about a year. Denies any treatments. Also relates some cramping in the bottom of her feet that has been difficult to deal with.  . Denies any other pedal complaints. Denies n/v/f/c.   History reviewed. No pertinent past medical history.  Objective:  Physical Exam: Vascular: DP/PT pulses 2/4 bilateral. CFT <3 seconds. Normal hair growth on digits. No edema.  Skin. No lacerations or abrasions bilateral feet. Bilateral hallux nails are thickened discolored and dystrophic with subungual debris.  Musculoskeletal: MMT 5/5 bilateral lower extremities in DF, PF, Inversion and Eversion. Deceased ROM in DF of ankle joint bilateral. No pain to palpation currently to plantar feet.  Neurological: Sensation intact to light touch.   Assessment:   1. Onychomycosis   2. Cramping of feet      Plan:  Patient was evaluated and treated and all questions answered. -Examined patient -Discussed treatment options for painful dystrophic nails  -Clinical picture and Fungal culture was obtained by removing a portion of the hard nail itself from each of the involved toenails using a sterile nail nipper and sent to [facility] lab. Patient tolerated the biopsy procedure well without discomfort or need for anesthesia.  -Discussed fungal nail treatment options including oral, topical, and laser treatments.  -Discussed hydration and stretching for feet cramps. Discussed trying yellow mustard.  -Patient to return in 4 weeks for follow up evaluation and discussion of fungal culture results or sooner if symptoms worsen.   Louann Sjogren, DPM

## 2023-03-22 NOTE — Patient Instructions (Signed)

## 2023-05-06 ENCOUNTER — Ambulatory Visit (INDEPENDENT_AMBULATORY_CARE_PROVIDER_SITE_OTHER): Payer: Medicaid Other | Admitting: Podiatry

## 2023-05-06 DIAGNOSIS — Z91199 Patient's noncompliance with other medical treatment and regimen due to unspecified reason: Secondary | ICD-10-CM

## 2023-05-06 NOTE — Progress Notes (Signed)
No show

## 2023-05-22 ENCOUNTER — Ambulatory Visit: Payer: Medicaid Other | Admitting: Podiatry

## 2023-05-29 ENCOUNTER — Ambulatory Visit (INDEPENDENT_AMBULATORY_CARE_PROVIDER_SITE_OTHER): Payer: Medicaid Other | Admitting: Podiatry

## 2023-05-29 DIAGNOSIS — Z91199 Patient's noncompliance with other medical treatment and regimen due to unspecified reason: Secondary | ICD-10-CM

## 2023-05-29 NOTE — Progress Notes (Signed)
No show

## 2023-06-04 ENCOUNTER — Ambulatory Visit (INDEPENDENT_AMBULATORY_CARE_PROVIDER_SITE_OTHER): Payer: Medicaid Other | Admitting: Podiatry

## 2023-06-04 DIAGNOSIS — Z91199 Patient's noncompliance with other medical treatment and regimen due to unspecified reason: Secondary | ICD-10-CM

## 2023-06-04 NOTE — Progress Notes (Signed)
No show

## 2023-06-24 ENCOUNTER — Ambulatory Visit: Payer: Medicaid Other | Admitting: Podiatry

## 2023-06-24 ENCOUNTER — Encounter: Payer: Self-pay | Admitting: Podiatry

## 2023-06-24 DIAGNOSIS — B351 Tinea unguium: Secondary | ICD-10-CM

## 2023-06-24 NOTE — Progress Notes (Signed)
  Subjective:  Patient ID: Jacqueline Carr, female    DOB: 06/18/1996,   MRN: [id number]  Chief Complaint  Patient presents with   Nail Problem    Pt     27 y.o. female presents for concern of bilateral toenail fungus that has been going on for about a year. Denies any treatments.  . Denies any other pedal complaints. Denies n/v/f/c.   No past medical history on file.  Objective:  Physical Exam: Vascular: DP/PT pulses 2/4 bilateral. CFT <3 seconds. Normal hair growth on digits. No edema.  Skin. No lacerations or abrasions bilateral feet. Bilateral hallux nails are thickened discolored and dystrophic with subungual debris.  Musculoskeletal: MMT 5/5 bilateral lower extremities in DF, PF, Inversion and Eversion. Deceased ROM in DF of ankle joint bilateral. No pain to palpation currently to plantar feet.  Neurological: Sensation intact to light touch.   Assessment:   1. Mycotic toenails       Plan:  Patient was evaluated and treated and all questions answered. -Examined patient -Discussed treatment options for painful dystrophic nails  -Culture results reviewed and negative for fungus.  -Discussed urea and vicks and shoe gear modification.  -Patient to return as needed   Louann Sjogren, DPM

## 2024-03-11 ENCOUNTER — Encounter: Payer: Self-pay | Admitting: Ophthalmology

## 2024-03-11 NOTE — H&P (Signed)
 Jacqueline Carr is an 28 y.o. female.   Chief Complaint:  I have  this bump above my right  that has grown  larger and is causing discomfort.  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   History reviewed. No pertinent past medical history.  History reviewed. No pertinent surgical history.  History reviewed. No pertinent family history. Social History:  reports that she has never smoked. She has never used smokeless tobacco. She reports current alcohol use. She reports that she does not use drugs.  Allergies: No Known Allergies  No medications prior to admission.    No results found for this or any previous visit (from the past 48 hours). No results found.  Review of Systems  Constitutional: Negative.   HENT: Negative.    Eyes: Negative.   Cardiovascular: Negative.   Gastrointestinal: Negative.   Endocrine: Negative.   Genitourinary: Negative.   Musculoskeletal: Negative.   Allergic/Immunologic: Negative.   Neurological: Negative.   Hematological: Negative.   Psychiatric/Behavioral: Negative.      unknown if currently breastfeeding. Physical Exam Constitutional:      Appearance: Normal appearance. She is normal weight.  HENT:     Head: Normocephalic and atraumatic.  Eyes:     Extraocular Movements: Extraocular movements intact.     Pupils: Pupils are equal, round, and reactive to light.      Comments: Supra orbital  dermoid cyst  Cardiovascular:     Rate and Rhythm: Normal rate.  Pulmonary:     Effort: Pulmonary effort is normal.     Breath sounds: Normal breath sounds.  Abdominal:     General: Abdomen is flat.  Musculoskeletal:        General: Normal range of motion.     Cervical back: Normal range of motion.  Neurological:     General: No focal deficit present.     Mental Status: She is alert.      Assessment/Plan Congenital R supra orbital dermoid  lesion . P. Excisional removal of R supra orbital dermoid cyst.  [name], MD 03/11/2024, 8:53 AM

## 2024-03-24 ENCOUNTER — Encounter (HOSPITAL_COMMUNITY): Payer: Self-pay | Admitting: Ophthalmology

## 2024-03-24 ENCOUNTER — Other Ambulatory Visit: Payer: Self-pay

## 2024-03-24 NOTE — Progress Notes (Signed)
 SDW call  Patient was given pre-op instructions over the phone. Patient verbalized understanding of instructions provided. Patient very hesitant when asked who would be in the home with her after surgery.  After pressing her for a name, she said she was unaware she needed someone and did not have anyone. States she also has a child in the home to take care of.  Explained about anesthesia and the need for someone in the home for 24 hours.  Patient was given the phone number to Dr. Alejandra Hurst office to re-schedule surgery until arrangements could be made for after surgery. Charge nurse, Ludwig Safer made aware.      PCP - Lorette Ronco, PA-C Cardiologist -  Pulmonary:    PPM/ICD - denies Device Orders - na Rep Notified - na   Chest x-ray - na EKG -  na Stress Test - ECHO -  Cardiac Cath -   Sleep Study/sleep apnea/CPAP: denies  Non-diabetic  Blood Thinner Instructions: denies Aspirin Instructions:denies   ERAS Protcol - Clears until 0715   Anesthesia review: No   Patient denies shortness of breath, fever, cough and chest pain over the phone call  Your procedure is scheduled on Wednesday Mar 25, 2024  Report to [facility] Main Entrance "A" at  0745  A.M., then check in with the Admitting office.  Call this number if you have problems the morning of surgery:  [phone]   If you have any questions prior to your surgery date call [phone]: Open Monday-Friday 8am-4pm If you experience any cold or flu symptoms such as cough, fever, chills, shortness of breath, etc. between now and your scheduled surgery, please notify us  at the above number    Remember:  Do not eat after midnight the night before your surgery  You may drink clear liquids until  0715  the morning of your surgery.   Clear liquids allowed are: Water, Non-Citrus Juices (without pulp), Carbonated Beverages, Clear Tea, Black Coffee ONLY (NO MILK, CREAM OR POWDERED CREAMER of any kind), and Gatorade   Take  these medicines the morning of surgery with A SIP OF WATER: None  As of today, STOP taking any Aspirin (unless otherwise instructed by your surgeon) Aleve, Naproxen, Ibuprofen , Motrin , Advil , Goody's, BC's, all herbal medications, fish oil, and all vitamins.

## 2024-03-25 ENCOUNTER — Encounter (HOSPITAL_COMMUNITY): Admission: RE | Payer: Self-pay | Source: Home / Self Care

## 2024-03-25 ENCOUNTER — Ambulatory Visit (HOSPITAL_COMMUNITY): Admission: RE | Admit: 2024-03-25 | Source: Home / Self Care | Admitting: Ophthalmology

## 2024-03-25 ENCOUNTER — Encounter (HOSPITAL_COMMUNITY): Payer: Self-pay | Admitting: Anesthesiology

## 2024-03-25 SURGERY — EXCISION, LESION, ORBIT
Anesthesia: General | Laterality: Right

## 2024-04-03 ENCOUNTER — Encounter (HOSPITAL_BASED_OUTPATIENT_CLINIC_OR_DEPARTMENT_OTHER): Payer: Self-pay | Admitting: Ophthalmology

## 2024-04-03 NOTE — H&P (Signed)
 Jacqueline Carr is an 28 y.o. female.   Chief Complaint:  I have  this bump above my right  that has grown  larger and is causing discomfort.  Hx: 28 y.o. 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   History reviewed. No pertinent past medical history.  Past Surgical History:  Procedure Laterality Date   DILATION AND CURETTAGE OF UTERUS      History reviewed. No pertinent family history. Social History:  reports that she has never smoked. She has never used smokeless tobacco. She reports current alcohol use. She reports that she does not use drugs.  Allergies: No Known Allergies  No medications prior to admission.    No results found for this or any previous visit (from the past 48 hours). No results found.  Review of Systems  Constitutional: Negative.   HENT: Negative.    Eyes: Negative.   Cardiovascular: Negative.   Gastrointestinal: Negative.   Endocrine: Negative.   Genitourinary: Negative.   Musculoskeletal: Negative.   Allergic/Immunologic: Negative.   Neurological: Negative.   Hematological: Negative.   Psychiatric/Behavioral: Negative.      Height 5' 3.5" (1.613 m), weight 88.5 kg, last menstrual period 02/24/2024, unknown if currently breastfeeding. Physical Exam Constitutional:      Appearance: Normal appearance. She is normal weight.  HENT:     Head: Normocephalic and atraumatic.  Eyes:     Extraocular Movements: Extraocular movements intact.     Pupils: Pupils are equal, round, and reactive to light.      Comments: Supra orbital  dermoid cyst  Cardiovascular:     Rate and Rhythm: Normal rate.  Pulmonary:     Effort: Pulmonary effort is normal.     Breath sounds: Normal breath sounds.  Abdominal:     General: Abdomen is flat.  Musculoskeletal:        General: Normal range of motion.     Cervical back: Normal range of motion.  Neurological:     General: No  focal deficit present.     Mental Status: She is alert.      Assessment/Plan Congenital R supra orbital dermoid lesion . P. Excisional removal of R supra orbital dermoid cyst.  Jacqueline Rolling, MD 04/03/2024, 12:58 PM

## 2024-04-08 ENCOUNTER — Encounter (HOSPITAL_BASED_OUTPATIENT_CLINIC_OR_DEPARTMENT_OTHER): Admission: RE | Payer: Self-pay | Source: Home / Self Care

## 2024-04-08 ENCOUNTER — Ambulatory Visit (HOSPITAL_BASED_OUTPATIENT_CLINIC_OR_DEPARTMENT_OTHER): Admission: RE | Admit: 2024-04-08 | Source: Home / Self Care | Admitting: Ophthalmology

## 2024-04-08 SURGERY — EXCISION, LESION, ORBIT
Anesthesia: General | Laterality: Right

## 2024-06-21 ENCOUNTER — Encounter (HOSPITAL_BASED_OUTPATIENT_CLINIC_OR_DEPARTMENT_OTHER): Payer: Self-pay | Admitting: Ophthalmology

## 2024-06-21 NOTE — H&P (Signed)
 Jacqueline Carr is an 28 y.o. female.   Chief Complaint:  I have  this bump above my right  that has grown  larger and is causing discomfort.  Hx: 28 y.o. 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   History reviewed. No pertinent past medical history.  Past Surgical History:  Procedure Laterality Date   DILATION AND CURETTAGE OF UTERUS      History reviewed. No pertinent family history. Social History:  reports that she has never smoked. She has never used smokeless tobacco. She reports current alcohol use. She reports that she does not use drugs.  Allergies: No Known Allergies  No medications prior to admission.    No results found for this or any previous visit (from the past 48 hours). No results found.  Review of Systems  Constitutional: Negative.   HENT: Negative.    Eyes: Negative.   Cardiovascular: Negative.   Gastrointestinal: Negative.   Endocrine: Negative.   Genitourinary: Negative.   Musculoskeletal: Negative.   Allergic/Immunologic: Negative.   Neurological: Negative.   Hematological: Negative.   Psychiatric/Behavioral: Negative.      unknown if currently breastfeeding. Physical Exam Constitutional:      Appearance: Normal appearance. She is normal weight.  HENT:     Head: Normocephalic and atraumatic.  Eyes:     Extraocular Movements: Extraocular movements intact.     Pupils: Pupils are equal, round, and reactive to light.      Comments: Supra orbital  dermoid cyst  Cardiovascular:     Rate and Rhythm: Normal rate.  Pulmonary:     Effort: Pulmonary effort is normal.     Breath sounds: Normal breath sounds.  Abdominal:     General: Abdomen is flat.  Musculoskeletal:        General: Normal range of motion.     Cervical back: Normal range of motion.  Neurological:     General: No focal deficit present.     Mental Status: She is alert.       Assessment/Plan Congenital R supra orbital dermoid lesion . P. Excisional removal of R supra orbital dermoid cyst.  Ozell Mirza, MD 06/21/2024, 6:29 PM

## 2024-06-24 ENCOUNTER — Encounter (HOSPITAL_BASED_OUTPATIENT_CLINIC_OR_DEPARTMENT_OTHER): Admission: RE | Payer: Self-pay | Source: Home / Self Care

## 2024-06-24 ENCOUNTER — Ambulatory Visit (HOSPITAL_BASED_OUTPATIENT_CLINIC_OR_DEPARTMENT_OTHER): Admission: RE | Admit: 2024-06-24 | Source: Home / Self Care | Admitting: Ophthalmology

## 2024-06-24 SURGERY — EXCISION, LESION, ORBIT
Anesthesia: General | Laterality: Right
